# Patient Record
Sex: Female | Born: 1960 | Race: White | Hispanic: No | Marital: Single | Smoking: Never smoker
Health system: Southern US, Community
[De-identification: ages and names within clinical notes are randomized; demographics above are authoritative.]

## PROBLEM LIST (undated history)

## (undated) DIAGNOSIS — Z789 Other specified health status: Secondary | ICD-10-CM

## (undated) HISTORY — PX: TMJ ARTHROPLASTY: SHX1066

## (undated) HISTORY — PX: KNEE ARTHROSCOPY: SUR90

## (undated) HISTORY — DX: Other specified health status: Z78.9

---

## 2004-07-09 ENCOUNTER — Ambulatory Visit: Payer: Self-pay | Admitting: Family Medicine

## 2005-11-26 ENCOUNTER — Ambulatory Visit: Payer: Self-pay | Admitting: Family Medicine

## 2006-08-11 ENCOUNTER — Telehealth (INDEPENDENT_AMBULATORY_CARE_PROVIDER_SITE_OTHER): Payer: Self-pay | Admitting: *Deleted

## 2006-08-15 DIAGNOSIS — F329 Major depressive disorder, single episode, unspecified: Secondary | ICD-10-CM

## 2006-08-15 DIAGNOSIS — F3289 Other specified depressive episodes: Secondary | ICD-10-CM | POA: Insufficient documentation

## 2006-08-31 ENCOUNTER — Ambulatory Visit: Payer: Self-pay | Admitting: Family Medicine

## 2007-07-06 ENCOUNTER — Other Ambulatory Visit: Admission: RE | Admit: 2007-07-06 | Discharge: 2007-07-06 | Payer: Self-pay | Admitting: Family Medicine

## 2007-07-06 ENCOUNTER — Ambulatory Visit: Payer: Self-pay | Admitting: Family Medicine

## 2007-07-06 ENCOUNTER — Encounter (INDEPENDENT_AMBULATORY_CARE_PROVIDER_SITE_OTHER): Payer: Self-pay | Admitting: Internal Medicine

## 2007-07-07 ENCOUNTER — Encounter (INDEPENDENT_AMBULATORY_CARE_PROVIDER_SITE_OTHER): Payer: Self-pay | Admitting: Internal Medicine

## 2007-07-10 ENCOUNTER — Encounter: Payer: Self-pay | Admitting: Family Medicine

## 2007-07-17 ENCOUNTER — Encounter (INDEPENDENT_AMBULATORY_CARE_PROVIDER_SITE_OTHER): Payer: Self-pay | Admitting: Internal Medicine

## 2007-07-17 ENCOUNTER — Encounter (INDEPENDENT_AMBULATORY_CARE_PROVIDER_SITE_OTHER): Payer: Self-pay | Admitting: *Deleted

## 2007-11-30 ENCOUNTER — Encounter (INDEPENDENT_AMBULATORY_CARE_PROVIDER_SITE_OTHER): Payer: Self-pay | Admitting: *Deleted

## 2008-03-07 ENCOUNTER — Encounter (INDEPENDENT_AMBULATORY_CARE_PROVIDER_SITE_OTHER): Payer: Self-pay | Admitting: Internal Medicine

## 2008-03-14 ENCOUNTER — Telehealth (INDEPENDENT_AMBULATORY_CARE_PROVIDER_SITE_OTHER): Payer: Self-pay | Admitting: Internal Medicine

## 2008-05-29 ENCOUNTER — Ambulatory Visit: Payer: Self-pay | Admitting: Family Medicine

## 2008-05-29 DIAGNOSIS — M79609 Pain in unspecified limb: Secondary | ICD-10-CM

## 2008-09-30 ENCOUNTER — Ambulatory Visit: Payer: Self-pay | Admitting: Family Medicine

## 2008-10-08 ENCOUNTER — Ambulatory Visit: Payer: Self-pay | Admitting: Family Medicine

## 2008-10-11 ENCOUNTER — Telehealth (INDEPENDENT_AMBULATORY_CARE_PROVIDER_SITE_OTHER): Payer: Self-pay | Admitting: Internal Medicine

## 2009-09-16 ENCOUNTER — Ambulatory Visit: Payer: Self-pay

## 2012-02-09 ENCOUNTER — Ambulatory Visit: Payer: Self-pay

## 2012-03-19 ENCOUNTER — Ambulatory Visit: Payer: Self-pay | Admitting: Physician Assistant

## 2012-08-08 ENCOUNTER — Ambulatory Visit: Payer: Self-pay | Admitting: Specialist

## 2012-12-06 ENCOUNTER — Ambulatory Visit: Payer: Self-pay | Admitting: General Practice

## 2014-02-12 ENCOUNTER — Ambulatory Visit: Payer: Self-pay | Admitting: Family Medicine

## 2014-06-21 NOTE — Op Note (Signed)
PATIENT NAME:  [name], [name]  DATE OF PROCEDURE:  12/06/2012  PREOPERATIVE DIAGNOSIS:  Internal derangement of the left knee.   POSTOPERATIVE DIAGNOSES: 1.  Tear of the posterior horn of the medial meniscus, left knee.  2.  Tear of the anterior horn, lateral meniscus, left knee.  3.  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   PROCEDURE:  Left knee arthroscopy, partial medial and lateral meniscectomies and chondroplasty of the medial and patellofemoral compartments.   SURGEON:  Illene LabradorJames P. Hooten, M.D.   ANESTHESIA:  General.   ESTIMATED BLOOD LOSS:  Minimal.   TOURNIQUET TIME:  Not used.   DRAINS:  None.   INDICATIONS FOR SURGERY:  The patient is a 54 year old female who has been seen for complaints of persistent left knee pain.  MRI demonstrated findings consistent with meniscal pathology.  After discussion of the risks and benefits of surgical intervention, the patient expressed understanding of the risks and benefits and agreed with plans for surgical intervention.   PROCEDURE IN DETAIL:  The patient was brought into the Operating Room and, after adequate general anesthesia was achieved, a tourniquet was placed on the patient's left thigh and leg was placed in a leg holder.  All bony prominences were well-padded.  The left knee and leg were cleaned and prepped with alcohol and DuraPrep, draped in the usual sterile fashion.  A "timeout" was performed as per usual protocol.  The anticipated portal sites were injected with 0.25% Marcaine with epinephrine.  An anterolateral portal was created and a cannula was inserted.  A moderate effusion was evacuated.  The scope was inserted and the knee was inflated with using the Stryker pump.  The scope was advanced down to the medial gutter, into the medial compartment of the knee.  There was a complex degenerative tear involving the posterior horn of the medial meniscus.   The tear was debrided using meniscal punches and a 4.5 mm shaver.  Final contouring was performed using the ArthroCare wand.  The anterior horn of the medial meniscus was visualized and probed and felt to be stable.  Inspection of the articular cartilage demonstrated an area of grade IV chondromalacia involving the distal aspect of the medial femoral condyle.  The area was debrided and margins contoured.  The scope was advanced into the intracondylar region.  The anterior cruciate ligament was visualized and probed and felt to be stable.  The scope was removed from the anterolateral portal and reinserted via the anteromedial portal so as to better visualize the lateral compartment.  The posterior horn of the lateral meniscus was visualized and probed and felt to be stable.  There was a complex tear involving the anterior horn of the lateral meniscus.  This was debrided using meniscal punches and a 4.5 mm shaver with final contouring performed using the ArthroCare wand.  The remaining rim of meniscus was visualized and probed and felt to stable.  The articular surface was in reasonably good condition.  Finally, the scope was positioned so as to visualize the patellofemoral articulation.  The articular surface of the patella was in reasonably good condition.  However, there was an area of grade IV chondromalacia in the trochlear groove.  The area was debrided and contoured using the ArthroCare wand.  Mild synovectomy was performed along the site.  Hemostasis was achieved using the ArthroCare wand.  The knee was irrigated with copious amounts of fluid and then suctioned dry.  The anterolateral portal was reapproximated using #3-0 nylon.  A combination of 0.25% Marcaine with epinephrine and 4 mg of morphine was injected via the scope.  The scope was removed and the anteromedial portal was reapproximated using #3-0 nylon.  A sterile dressing was applied followed by application of ice wrap.    The patient tolerated the  procedure well.  She was transported to the recovery room in stable condition.     ____________________________ Illene Labrador. Angie Fava., MD jph:ea D: 12/07/2012 00:12:55 ET T: 12/07/2012 01:26:03 ET JOB#: [id number]  cc: Illene Labrador. Angie Fava., MD, <Dictator> JAMES P Angie Fava MD ELECTRONICALLY SIGNED 12/09/2012 15:11

## 2015-01-28 ENCOUNTER — Other Ambulatory Visit: Payer: Self-pay | Admitting: Internal Medicine

## 2015-01-28 DIAGNOSIS — Z1231 Encounter for screening mammogram for malignant neoplasm of breast: Secondary | ICD-10-CM

## 2015-02-18 ENCOUNTER — Ambulatory Visit
Admission: RE | Admit: 2015-02-18 | Discharge: 2015-02-18 | Disposition: A | Discharge status: Home / Self Care | Payer: No Typology Code available for payment source | Source: Ambulatory Visit | Attending: Internal Medicine | Admitting: Internal Medicine

## 2015-02-18 DIAGNOSIS — Z1231 Encounter for screening mammogram for malignant neoplasm of breast: Secondary | ICD-10-CM | POA: Insufficient documentation

## 2015-04-17 ENCOUNTER — Ambulatory Visit: Payer: Managed Care, Other (non HMO) | Attending: Internal Medicine

## 2015-04-17 DIAGNOSIS — G4733 Obstructive sleep apnea (adult) (pediatric): Secondary | ICD-10-CM | POA: Diagnosis not present

## 2016-01-16 ENCOUNTER — Other Ambulatory Visit: Payer: Self-pay | Admitting: Internal Medicine

## 2016-01-16 DIAGNOSIS — Z1231 Encounter for screening mammogram for malignant neoplasm of breast: Secondary | ICD-10-CM

## 2016-02-19 ENCOUNTER — Ambulatory Visit: Admission: RE | Admit: 2016-02-19 | Payer: No Typology Code available for payment source | Source: Ambulatory Visit

## 2016-03-10 ENCOUNTER — Ambulatory Visit: Admission: RE | Admit: 2016-03-10 | Payer: No Typology Code available for payment source | Source: Ambulatory Visit

## 2016-03-17 ENCOUNTER — Ambulatory Visit: Admission: RE | Admit: 2016-03-17 | Payer: No Typology Code available for payment source | Source: Ambulatory Visit

## 2016-03-23 ENCOUNTER — Ambulatory Visit
Admission: RE | Admit: 2016-03-23 | Discharge: 2016-03-23 | Disposition: A | Discharge status: Home / Self Care | Payer: BLUE CROSS/BLUE SHIELD | Source: Ambulatory Visit | Attending: Internal Medicine | Admitting: Internal Medicine

## 2016-03-23 ENCOUNTER — Encounter: Payer: Self-pay | Admitting: Radiology

## 2016-03-23 DIAGNOSIS — Z1231 Encounter for screening mammogram for malignant neoplasm of breast: Secondary | ICD-10-CM | POA: Diagnosis present

## 2017-01-11 ENCOUNTER — Ambulatory Visit (INDEPENDENT_AMBULATORY_CARE_PROVIDER_SITE_OTHER): Payer: BLUE CROSS/BLUE SHIELD

## 2017-01-11 ENCOUNTER — Ambulatory Visit (INDEPENDENT_AMBULATORY_CARE_PROVIDER_SITE_OTHER): Payer: BLUE CROSS/BLUE SHIELD | Admitting: Orthopaedic Surgery

## 2017-01-11 ENCOUNTER — Encounter: Payer: Self-pay | Admitting: Orthopaedic Surgery

## 2017-01-11 VITALS — BP 140/81 | HR 76 | Temp 97.1°F | Ht 66.5 in | Wt 275.0 lb

## 2017-01-11 DIAGNOSIS — G8929 Other chronic pain: Secondary | ICD-10-CM

## 2017-01-11 DIAGNOSIS — M5442 Lumbago with sciatica, left side: Secondary | ICD-10-CM

## 2017-01-11 MED ORDER — NAPROXEN 500 MG PO TABS: 500.0000 mg | ORAL_TABLET | Freq: Two times a day (BID) | ORAL | 5 refills | Status: AC

## 2017-01-11 NOTE — Progress Notes (Signed)
Subjective:    Patient ID: [name], female    DOB: 03/26/1960, 56 y.o.   MRN: [id number]  HPI She has had left hip pain and lower side of the lumbar spine pain for many months.  [place] was seen in [place] at the [facility] in January and February of this year and had x-rays which were read as negative of the hip on the left and the lumbar spine. I have reviewed the notes. She has taken Lodine with some help.  She has pain to the left hip and to the upper thigh area on the left.  She has no trauma.  She has lost weight since the first of the year and that has not helped that much.  She is being more active and sees little help with that.  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  She is concerned that the pain is still there.  She has pain sleeping. She sleeps on her stomach and is trying to lay on her side more.     Review of Systems  HENT: Negative for congestion.   Respiratory: Negative for cough and shortness of breath.   Cardiovascular: Negative for chest pain and leg swelling.  Endocrine: Negative for cold intolerance.  Musculoskeletal: Positive for arthralgias and back pain.  Allergic/Immunologic: Negative for environmental allergies.   Past Medical History:  Diagnosis Date  . Known health problems: none     Past Surgical History:  Procedure Laterality Date  . KNEE ARTHROSCOPY    . TMJ ARTHROPLASTY      Current Outpatient Medications on File Prior to Visit  Medication Sig Dispense Refill  . etodolac (LODINE) 500 MG tablet Take 500 mg by mouth.     No current facility-administered medications on file prior to visit.     Social History   Socioeconomic History  . Marital status: Single    Spouse name: Not on file  . Number of children: Not on file  . Years of education: Not on file  . Highest education level: Not on file  Social Needs  . Financial resource strain: Not on file  . Food insecurity - worry: Not on file  . Food insecurity - inability: Not on file    . Transportation needs - medical: Not on file  . Transportation needs - non-medical: Not on file  Occupational History  . Not on file  Tobacco Use  . Smoking status: Never Smoker  . Smokeless tobacco: Never Used  Substance and Sexual Activity  . Alcohol use: No    Frequency: Never  . Drug use: No  . Sexual activity: Not on file  Other Topics Concern  . Not on file  Social History Narrative  . Not on file    Family History  Problem Relation Age of Onset  . Hypertension Mother   . Clotting disorder Father   . Hypertension Father   . Breast cancer Neg Hx     BP 140/81   Pulse 76   Temp (!) 97.1 F (36.2 C)   Ht 5' 6.5" (1.689 m)   Wt 275 lb (124.7 kg)   LMP 12/31/2014 Comment: perimenopausal-irregular periods, not preg  BMI 43.72 kg/m      Objective:   Physical Exam  Constitutional: She is oriented to person, place, and time. She appears well-developed and well-nourished.  HENT:  Head: Normocephalic and atraumatic.  Eyes: Conjunctivae and EOM are normal. Pupils are equal, round, and reactive to light.  Neck: Normal range of  motion. Neck supple.  Cardiovascular: Normal rate, regular rhythm and intact distal pulses.  Pulmonary/Chest: Effort normal.  Abdominal: Soft.  Musculoskeletal: She exhibits tenderness (There is pain in the left lower back, no spasm.  ROM is full, she can touch her toes, NV intact, reflexes normal, SLR negative, Gait normal.).  Neurological: She is alert and oriented to person, place, and time. She displays normal reflexes. No cranial nerve deficit. She exhibits normal muscle tone. Coordination normal.  Skin: Skin is warm and dry.  Psychiatric: She has a normal mood and affect. Her behavior is normal. Judgment and thought content normal.  Vitals reviewed.  X-rays were done of the lumbar spine, reported separately.       Assessment & Plan:   Encounter Diagnosis  Name Primary?  . Chronic left-sided low back pain with left-sided sciatica  Yes   I will get a MRI of the lumbar spine as she has not improved since months of treatment.  I will call in Naprosyn to her drug store.  Stop the Lodine.  Return after the MRI.  Precautions on the Naprosyn given.  Take after eating.  Call if any problem.  Precautions discussed.   Electronically Signed Darreld McleanWayne Strother Everitt, MD 11/13/20183:01 PM

## 2017-01-31 ENCOUNTER — Ambulatory Visit
Admission: RE | Admit: 2017-01-31 | Discharge: 2017-01-31 | Disposition: A | Discharge status: Home / Self Care | Payer: BLUE CROSS/BLUE SHIELD | Source: Ambulatory Visit | Attending: Orthopaedic Surgery | Admitting: Orthopaedic Surgery

## 2017-01-31 DIAGNOSIS — G8929 Other chronic pain: Secondary | ICD-10-CM

## 2017-01-31 DIAGNOSIS — M5442 Lumbago with sciatica, left side: Principal | ICD-10-CM

## 2017-02-08 ENCOUNTER — Ambulatory Visit: Payer: BLUE CROSS/BLUE SHIELD | Admitting: Orthopaedic Surgery

## 2017-02-09 ENCOUNTER — Ambulatory Visit (INDEPENDENT_AMBULATORY_CARE_PROVIDER_SITE_OTHER): Payer: BLUE CROSS/BLUE SHIELD | Admitting: Orthopaedic Surgery

## 2017-02-09 ENCOUNTER — Encounter: Payer: Self-pay | Admitting: Orthopaedic Surgery

## 2017-02-09 VITALS — BP 153/96 | HR 75 | Temp 98.0°F | Ht 66.5 in | Wt 286.0 lb

## 2017-02-09 DIAGNOSIS — M48062 Spinal stenosis, lumbar region with neurogenic claudication: Secondary | ICD-10-CM | POA: Diagnosis not present

## 2017-02-09 MED ORDER — PREDNISONE 5 MG (21) PO TBPK: ORAL_TABLET | ORAL | 0 refills | Status: AC

## 2017-02-09 MED ORDER — HYDROCODONE-ACETAMINOPHEN 7.5-325 MG PO TABS: ORAL_TABLET | ORAL | 0 refills | Status: DC

## 2017-02-09 MED ORDER — TIZANIDINE HCL 4 MG PO TABS: ORAL_TABLET | ORAL | 3 refills | Status: AC

## 2017-02-09 MED ORDER — HYDROCODONE-ACETAMINOPHEN 7.5-325 MG PO TABS: 1.0000 | ORAL_TABLET | ORAL | 0 refills | Status: DC | PRN

## 2017-02-09 NOTE — Progress Notes (Signed)
Patient [name]:[name], female DOB:11/26/1960, 56 y.o. UJW:[id number]  Chief Complaint  Patient presents with  . Results    MRI Lumbar    HPI  [name] is a 56 y.o. female who has lower back pain with paresthesias to the left side.  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  The back pain is limiting her ability to practice.  She has more pain at night.  She had a MRI which showed:  IMPRESSION: 1. Transitional lumbosacral vertebra numbered S1 based on the lowest ribs by radiography 01/11/2017. 2. 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 3. L4-5 high-grade spinal stenosis. Asymmetric right subarticular recess impingement due to asymmetric disc bulging. 4. L5-S1 high-grade spinal stenosis. Left more than right subarticular recess impingement due to asymmetric disc bulge. 5. L2-3 small left paracentral disc protrusion with mild posterior displacement of the descending L3 nerve root. 6. No foraminal impingement.  I have explained the findings to her.  Her husband accompanied her.  I have recommended she see a neurosurgeon and consider epidural treatment.  I have explained the treatment possible options.  I spent 30 minutes with them going over this and things to do in her office to try to modify her pain such as raising the operating table, having a small lift for her foot to relieve back pain, etc.  I will begin Zanaflex at night, I have given pain medicine for her use at night when her pain is so bad and I have given a prednisone dose pack to use if pain get worse and the Naprosyn has not helped that much.  She is to stop the Naprosyn when on the dose pack. HPI  Body mass index is 45.47 kg/m.  ROS  Review of Systems  HENT: Negative for congestion.   Respiratory: Negative for cough and shortness of breath.   Cardiovascular: Negative for chest pain and leg swelling.  Endocrine:  Negative for cold intolerance.  Musculoskeletal: Positive for arthralgias and back pain.  Allergic/Immunologic: Negative for environmental allergies.  All other systems reviewed and are negative.   Past Medical History:  Diagnosis Date  . Known health problems: none     Past Surgical History:  Procedure Laterality Date  . KNEE ARTHROSCOPY    . TMJ ARTHROPLASTY      Family History  Problem Relation Age of Onset  . Hypertension Mother   . Clotting disorder Father   . Hypertension Father   . Breast cancer Neg Hx     Social History Social History   Tobacco Use  . Smoking status: Never Smoker  . Smokeless tobacco: Never Used  Substance Use Topics  . Alcohol use: No    Frequency: Never  . Drug use: No    Allergies  Allergen Reactions  . Penicillins     REACTION: Hives  . Sulfadiazine     REACTION: Hives    Current Outpatient Medications  Medication Sig Dispense Refill  . etodolac (LODINE) 500 MG tablet Take 500 mg by mouth.    . naproxen (NAPROSYN) 500 MG tablet Take 1 tablet (500 mg total) 2 (two) times daily with a meal by mouth. 60 tablet 5  . HYDROcodone-acetaminophen (NORCO) 7.5-325 MG tablet Take 1 tablet by mouth every 4 (four) hours as needed for moderate pain (Must last 30 days.  Do not drive a call or operate machinery while on this medicine.). 120 tablet 0  . predniSONE (STERAPRED UNI-PAK 21 TAB)  5 MG (21) TBPK tablet Take 6 pills first day; 5 pills second day; 4 pills third day; 3 pills fourth day; 2 pills next day and 1 pill last day. 21 tablet 0  . tiZANidine (ZANAFLEX) 4 MG tablet One by mouth every night before bed as needed for spasm 30 tablet 3   No current facility-administered medications for this visit.      Physical Exam  Blood pressure (!) 153/96, pulse 75, temperature 98 F (36.7 C), height 5' 6.5" (1.689 m), weight 286 lb (129.7 kg), last menstrual period 12/31/2014.  Constitutional: overall normal hygiene, normal nutrition, well  developed, normal grooming, normal body habitus. Assistive device:none  Musculoskeletal: gait and station Limp none, muscle tone and strength are normal, no tremors or atrophy is present.  .  Neurological: coordination overall normal.  Deep tendon reflex/nerve stretch intact.  Sensation normal.  Cranial nerves II-XII intact.   Skin:   Normal overall no scars, lesions, ulcers or rashes. No psoriasis.  Psychiatric: Alert and oriented x 3.  Recent memory intact, remote memory unclear.  Normal mood and affect. Well groomed.  Good eye contact.  Cardiovascular: overall no swelling, no varicosities, no edema bilaterally, normal temperatures of the legs and arms, no clubbing, cyanosis and good capillary refill.  Lymphatic: palpation is normal.  All other systems reviewed and are negative   Spine/Pelvis examination:  Inspection:  Overall, sacoiliac joint benign and hips nontender; without crepitus or defects.   Thoracic spine inspection: Alignment normal without kyphosis present   Lumbar spine inspection:  Alignment  with normal lumbar lordosis, without scoliosis apparent.   Thoracic spine palpation:  without tenderness of spinal processes   Lumbar spine palpation: with tenderness of lumbar area; without tightness of lumbar muscles    Range of Motion:   Lumbar flexion, forward flexion is 35 without pain or tenderness    Lumbar extension is 5 without pain or tenderness   Left lateral bend is Normal  without pain or tenderness   Right lateral bend is Normal without pain or tenderness   Straight leg raising is Normal   Strength & tone: Normal   Stability overall normal stability    The patient has been educated about the nature of the problem(s) and counseled on treatment options.  The patient appeared to understand what I have discussed and is in agreement with it.  Encounter Diagnosis  Name Primary?  . Spinal stenosis, lumbar region, with neurogenic claudication Yes    PLAN Call  if any problems.  Precautions discussed. Change medications as mentioned above. Take the Zanaflex when she does not have to go to work the next day to make sure she has no drowsiness.  Return to clinic PRN, to see the neurosurgeon   I have reviewed the [facility] Controlled Substance Reporting System web site prior to prescribing narcotic medicine for this patient.  Electronically Signed Darreld McleanWayne Bannie Lobban, MD 12/12/20189:20 AM

## 2017-02-09 NOTE — Addendum Note (Signed)
Addended by: Earnstine RegalKEELING, JOHN W on: 02/09/2017 11:43 AM   Modules accepted: Orders

## 2017-02-15 ENCOUNTER — Telehealth: Payer: Self-pay | Admitting: Orthopaedic Surgery

## 2017-02-15 MED ORDER — HYDROCODONE-ACETAMINOPHEN 7.5-325 MG PO TABS: ORAL_TABLET | ORAL | 0 refills | Status: DC

## 2017-02-15 NOTE — Telephone Encounter (Signed)
Patient requests refill on Hydrocodone/Acetaminophen 7.5-325  Mgs.   Qty  30  Sig: One tablet every four hours as needed for pain.  [facility] [place], [place]

## 2017-03-14 ENCOUNTER — Telehealth: Payer: Self-pay | Admitting: Radiology

## 2017-03-14 NOTE — Telephone Encounter (Signed)
 neurosurgery called back, they do not take patients insurance coverage.

## 2017-03-28 ENCOUNTER — Telehealth: Payer: Self-pay | Admitting: Orthopaedic Surgery

## 2017-03-28 MED ORDER — HYDROCODONE-ACETAMINOPHEN 7.5-325 MG PO TABS: ORAL_TABLET | ORAL | 0 refills | Status: AC

## 2017-03-28 NOTE — Telephone Encounter (Signed)
Patient requests refill on Hydrocodone/Acetaminophen 7.5-325 mgs.   Qty  30       Sig: One tablet every four hours as needed for pain.     She was unable to get into [facility] because they dont take her insurance.  She is attempting to get an appointment either at [facility] or [place] but has not been given an appointment yet.  She states hopefully she will get one next month.  She [facility] In [place], [place]

## 2017-06-14 ENCOUNTER — Other Ambulatory Visit: Payer: Self-pay | Admitting: Internal Medicine

## 2017-06-14 DIAGNOSIS — Z1231 Encounter for screening mammogram for malignant neoplasm of breast: Secondary | ICD-10-CM

## 2017-06-16 ENCOUNTER — Encounter (INDEPENDENT_AMBULATORY_CARE_PROVIDER_SITE_OTHER): Payer: Self-pay

## 2017-06-16 ENCOUNTER — Ambulatory Visit
Admission: RE | Admit: 2017-06-16 | Discharge: 2017-06-16 | Disposition: A | Discharge status: Home / Self Care | Payer: BLUE CROSS/BLUE SHIELD | Source: Ambulatory Visit | Attending: Internal Medicine | Admitting: Internal Medicine

## 2017-06-16 DIAGNOSIS — Z1231 Encounter for screening mammogram for malignant neoplasm of breast: Secondary | ICD-10-CM

## 2018-11-03 IMAGING — MG MM DIGITAL SCREENING BILAT W/ TOMO W/ CAD
8 of 13 series · 8 of 29 positions shown · non-contrast
Comparison: Previous exam(s).

CLINICAL DATA: Screening.

EXAM:
DIGITAL SCREENING BILATERAL MAMMOGRAM WITH TOMO AND CAD

[L CC (1 of 2)]
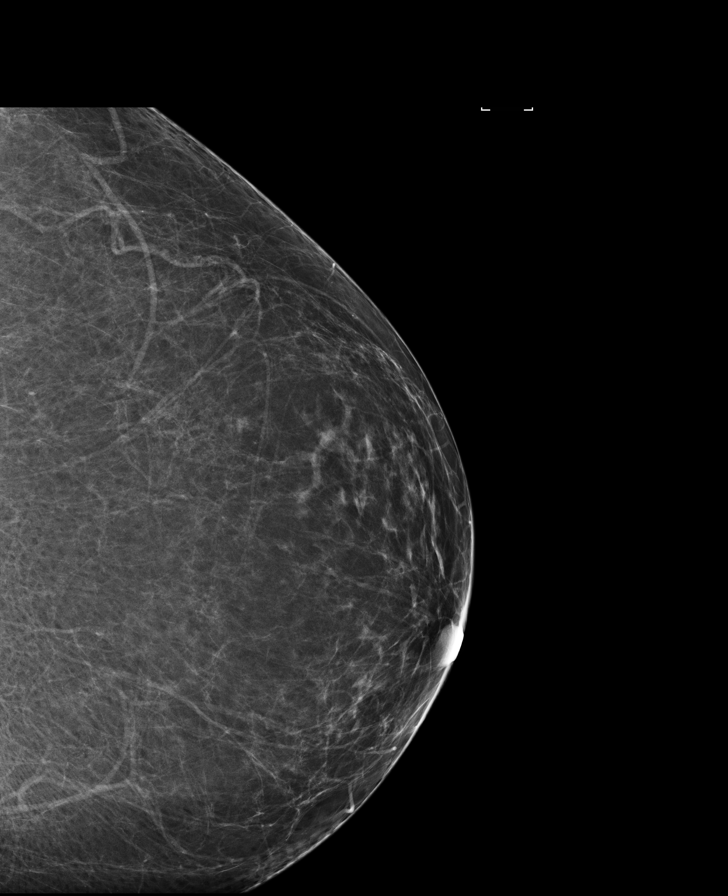

[R CC]
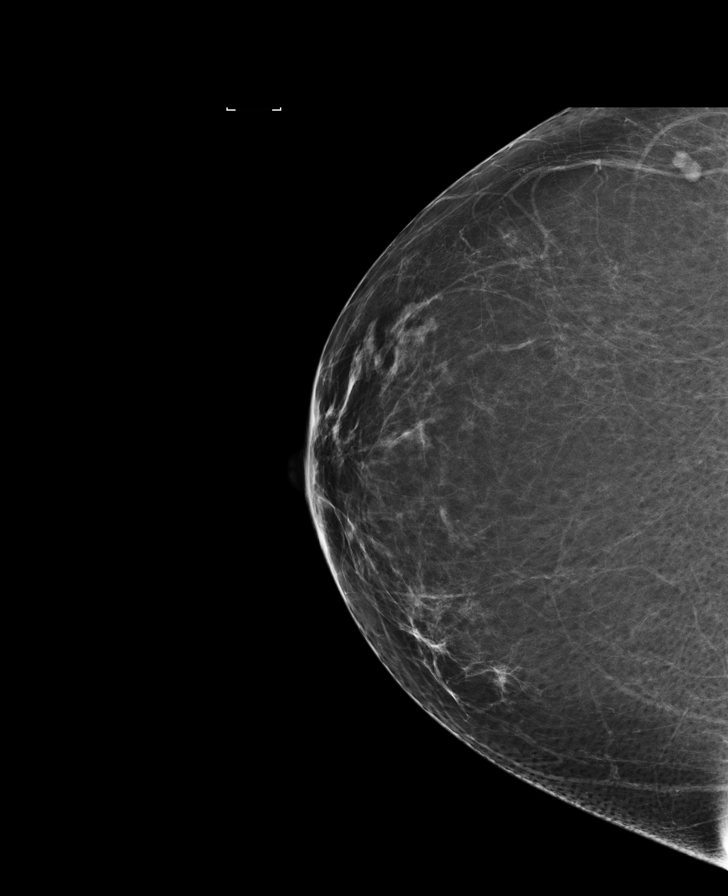

[L MLO]
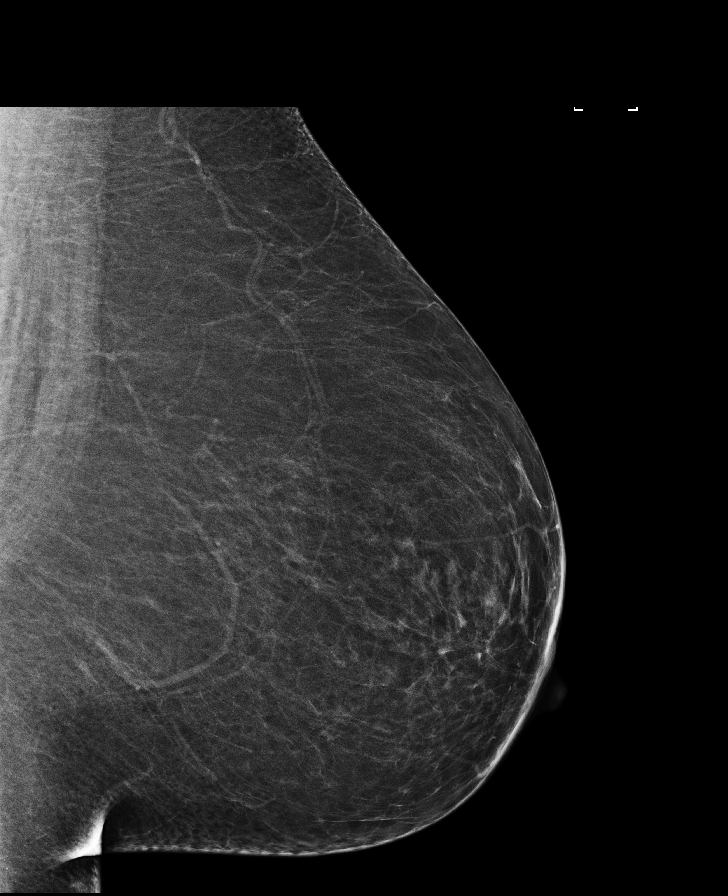

[R MLO]
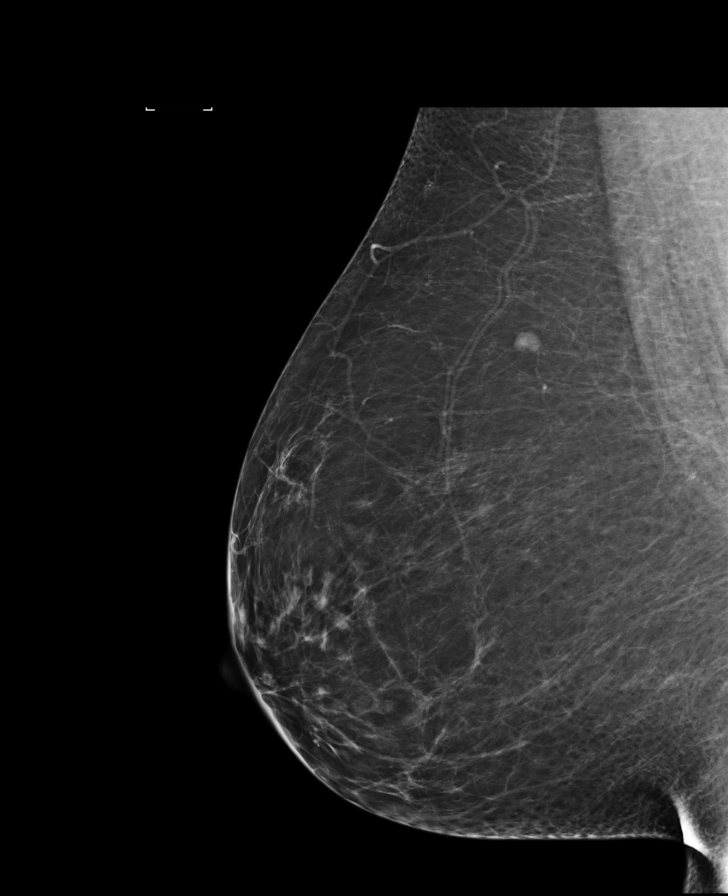

[L MLO synth-2D]
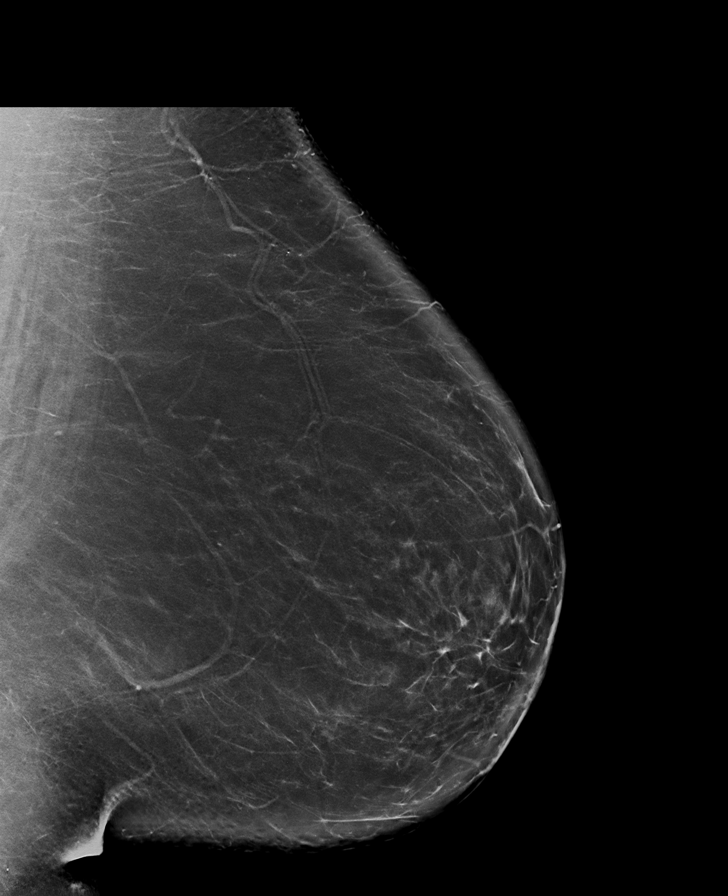

[R CC synth-2D]
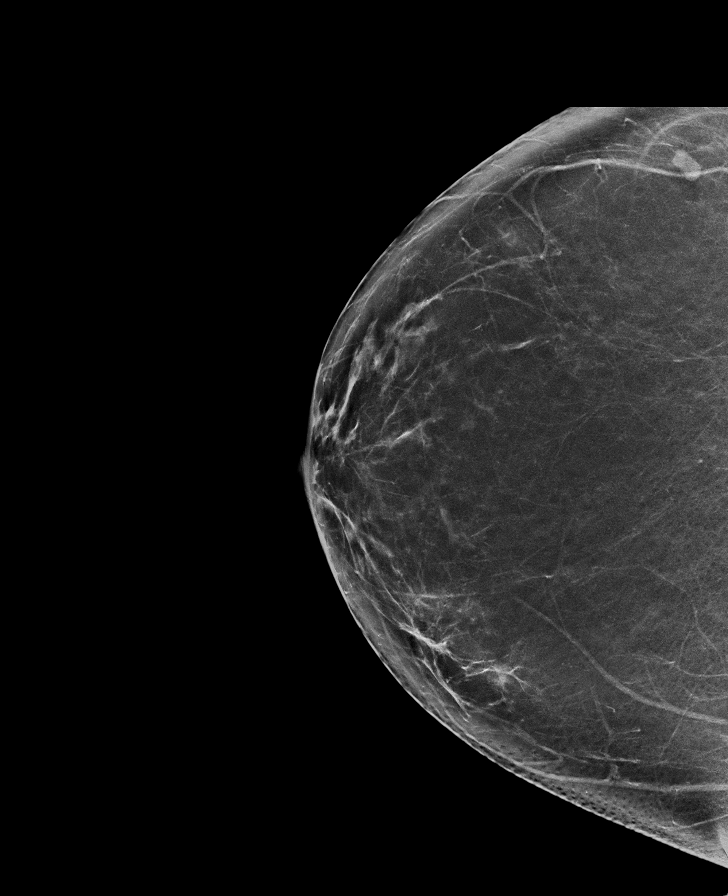

[L CC synth-2D]
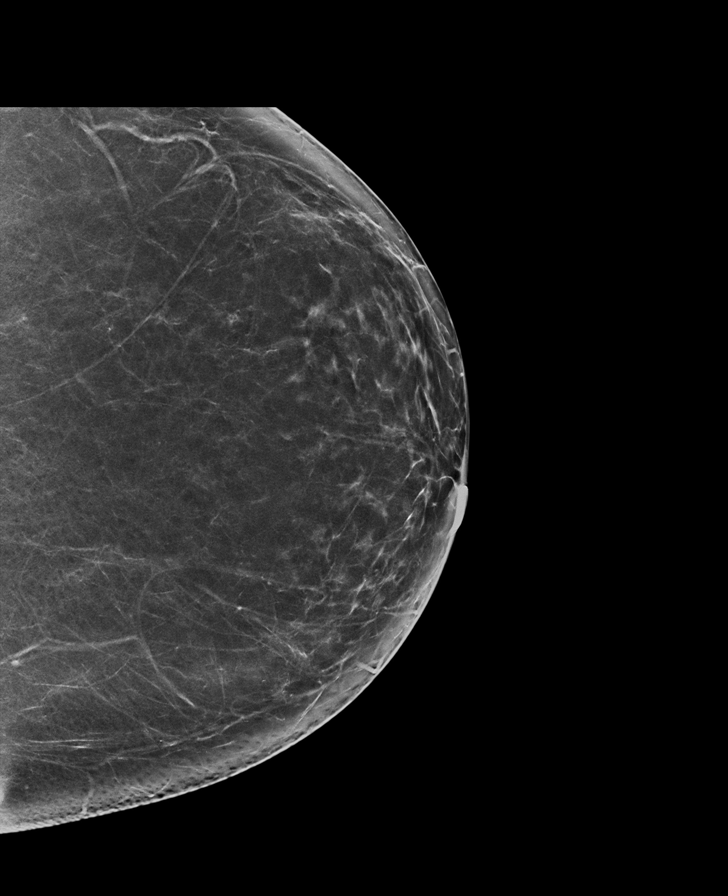

[L CC (2 of 2)]
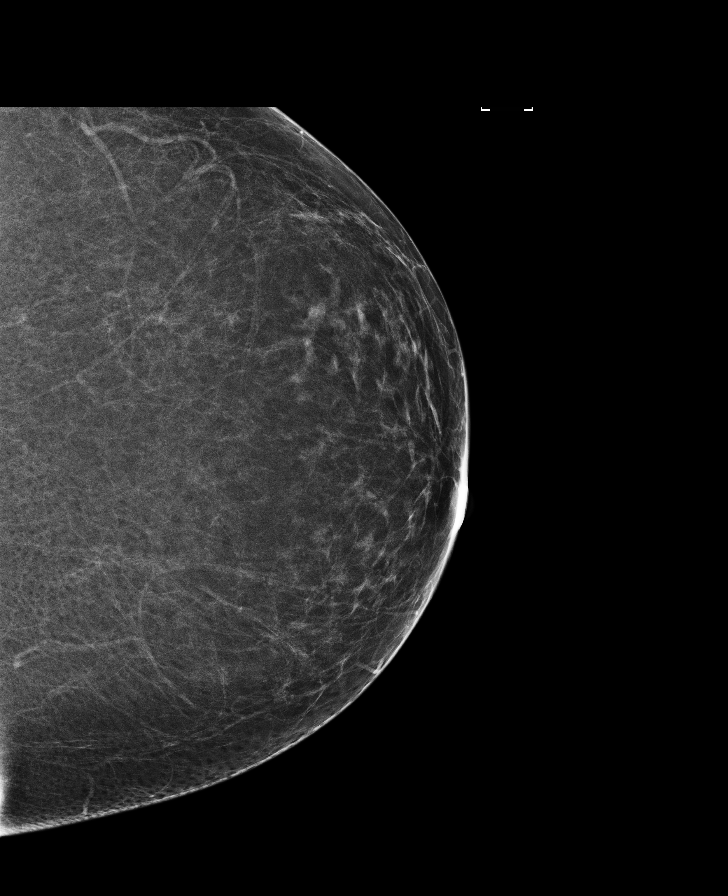

[8 of 29 positions shown; findings below may reference images not displayed]

ACR Breast Density Category b: There are scattered areas of
fibroglandular density.
FINDINGS: There are no findings suspicious for malignancy. Images were
processed with CAD.
IMPRESSION: No mammographic evidence of malignancy. A result letter of this
screening mammogram will be mailed directly to the patient.

RECOMMENDATION:
Screening mammogram in one year. (Code:[id number])

BI-RADS CATEGORY  1: Negative.

## 2019-02-22 ENCOUNTER — Ambulatory Visit: Payer: BLUE CROSS/BLUE SHIELD | Attending: Internal Medicine

## 2019-02-22 DIAGNOSIS — Z20822 Contact with and (suspected) exposure to covid-19: Secondary | ICD-10-CM

## 2019-02-23 LAB — NOVEL CORONAVIRUS, NAA: SARS-CoV-2, NAA: NOT DETECTED

## 2019-03-01 ENCOUNTER — Ambulatory Visit: Payer: BLUE CROSS/BLUE SHIELD | Attending: Internal Medicine

## 2019-03-01 DIAGNOSIS — Z20822 Contact with and (suspected) exposure to covid-19: Secondary | ICD-10-CM

## 2019-03-04 ENCOUNTER — Telehealth: Payer: Self-pay

## 2019-03-04 NOTE — Telephone Encounter (Signed)
Pt called for covid results advised that results are not back 

## 2019-03-06 ENCOUNTER — Ambulatory Visit: Payer: BLUE CROSS/BLUE SHIELD | Attending: Internal Medicine

## 2019-03-06 DIAGNOSIS — Z20822 Contact with and (suspected) exposure to covid-19: Secondary | ICD-10-CM

## 2019-03-07 LAB — NOVEL CORONAVIRUS, NAA

## 2019-03-08 LAB — NOVEL CORONAVIRUS, NAA: SARS-CoV-2, NAA: DETECTED — AB

## 2019-10-24 ENCOUNTER — Other Ambulatory Visit: Payer: Self-pay | Admitting: Internal Medicine

## 2019-10-24 DIAGNOSIS — Z1231 Encounter for screening mammogram for malignant neoplasm of breast: Secondary | ICD-10-CM

## 2020-01-25 ENCOUNTER — Ambulatory Visit: Admit: 2020-01-25 | Discharge status: Against Medical Advice | Payer: BLUE CROSS/BLUE SHIELD

## 2020-01-25 ENCOUNTER — Ambulatory Visit
Admission: EM | Admit: 2020-01-25 | Discharge: 2020-01-25 | Discharge status: Against Medical Advice | Payer: BLUE CROSS/BLUE SHIELD

## 2020-01-25 ENCOUNTER — Emergency Department
Admission: EM | Admit: 2020-01-25 | Discharge: 2020-01-25 | Disposition: A | Discharge status: Home / Self Care | Payer: BC Managed Care – PPO | Attending: Emergency Medicine | Admitting: Emergency Medicine

## 2020-01-25 ENCOUNTER — Other Ambulatory Visit: Payer: Self-pay

## 2020-01-25 DIAGNOSIS — R109 Unspecified abdominal pain: Secondary | ICD-10-CM | POA: Diagnosis present

## 2020-01-25 DIAGNOSIS — Z5321 Procedure and treatment not carried out due to patient leaving prior to being seen by health care provider: Secondary | ICD-10-CM | POA: Insufficient documentation

## 2020-01-25 DIAGNOSIS — R82998 Other abnormal findings in urine: Secondary | ICD-10-CM | POA: Insufficient documentation

## 2020-01-25 DIAGNOSIS — R1110 Vomiting, unspecified: Secondary | ICD-10-CM | POA: Diagnosis not present

## 2020-01-25 LAB — COMPREHENSIVE METABOLIC PANEL
ALT: 17 U/L (ref 0–44)
AST: 15 U/L (ref 15–41)
Albumin: 4.1 g/dL (ref 3.5–5.0)
Alkaline Phosphatase: 84 U/L (ref 38–126)
Anion gap: 11 (ref 5–15)
BUN: 13 mg/dL (ref 6–20)
CO2: 25 mmol/L (ref 22–32)
Calcium: 9 mg/dL (ref 8.9–10.3)
Chloride: 104 mmol/L (ref 98–111)
Creatinine, Ser: 0.82 mg/dL (ref 0.44–1.00)
GFR, Estimated: >60 mL/min (ref >60)
Glucose, Bld: 110 mg/dL — ABNORMAL HIGH (ref 70–99)
Potassium: 4.2 mmol/L (ref 3.5–5.1)
Sodium: 140 mmol/L (ref 135–145)
Total Bilirubin: 0.7 mg/dL (ref 0.3–1.2)
Total Protein: 7.7 g/dL (ref 6.5–8.1)

## 2020-01-25 LAB — CBC
HCT: 46.9 % — ABNORMAL HIGH (ref 36.0–46.0)
Hemoglobin: 15.2 g/dL — ABNORMAL HIGH (ref 12.0–15.0)
MCH: 29.5 pg (ref 26.0–34.0)
MCHC: 32.4 g/dL (ref 30.0–36.0)
MCV: 91.1 fL (ref 80.0–100.0)
Platelets: 326 10*3/uL (ref 150–400)
RBC: 5.15 MIL/uL — ABNORMAL HIGH (ref 3.87–5.11)
RDW: 12.6 % (ref 11.5–15.5)
WBC: 10.9 10*3/uL — ABNORMAL HIGH (ref 4.0–10.5)
nRBC: 0 % (ref 0.0–0.2)

## 2020-01-25 LAB — LIPASE, BLOOD: Lipase: 36 U/L (ref 11–51)

## 2020-01-25 NOTE — ED Triage Notes (Addendum)
Pt comes via POV from home with abdominal pain and vomiting the patient states this started two days ago. Pt states she thinks it is from her new medication.  Pt also states dark colored urine.

## 2020-01-26 ENCOUNTER — Ambulatory Visit: Payer: Self-pay

## 2020-01-28 ENCOUNTER — Ambulatory Visit
Admission: EM | Admit: 2020-01-28 | Discharge: 2020-01-28 | Disposition: A | Discharge status: Home / Self Care | Payer: BC Managed Care – PPO | Attending: Emergency Medicine | Admitting: Emergency Medicine

## 2020-01-28 ENCOUNTER — Other Ambulatory Visit: Payer: Self-pay

## 2020-01-28 DIAGNOSIS — R112 Nausea with vomiting, unspecified: Secondary | ICD-10-CM | POA: Diagnosis present

## 2020-01-28 DIAGNOSIS — R197 Diarrhea, unspecified: Secondary | ICD-10-CM | POA: Insufficient documentation

## 2020-01-28 LAB — CBC WITH DIFFERENTIAL/PLATELET
Abs Immature Granulocytes: 0.05 10*3/uL (ref 0.00–0.07)
Basophils Absolute: 0 10*3/uL (ref 0.0–0.1)
Basophils Relative: 0 %
Eosinophils Absolute: 0.5 10*3/uL (ref 0.0–0.5)
Eosinophils Relative: 5 %
HCT: 48 % — ABNORMAL HIGH (ref 36.0–46.0)
Hemoglobin: 16 g/dL — ABNORMAL HIGH (ref 12.0–15.0)
Immature Granulocytes: 1 %
Lymphocytes Relative: 14 %
Lymphs Abs: 1.4 10*3/uL (ref 0.7–4.0)
MCH: 29.5 pg (ref 26.0–34.0)
MCHC: 33.3 g/dL (ref 30.0–36.0)
MCV: 88.6 fL (ref 80.0–100.0)
Monocytes Absolute: 0.8 10*3/uL (ref 0.1–1.0)
Monocytes Relative: 8 %
Neutro Abs: 7.1 10*3/uL (ref 1.7–7.7)
Neutrophils Relative %: 72 %
Platelets: 317 10*3/uL (ref 150–400)
RBC: 5.42 MIL/uL — ABNORMAL HIGH (ref 3.87–5.11)
RDW: 12.8 % (ref 11.5–15.5)
WBC: 9.8 10*3/uL (ref 4.0–10.5)
nRBC: 0 % (ref 0.0–0.2)

## 2020-01-28 LAB — URINALYSIS, COMPLETE (UACMP) WITH MICROSCOPIC
Glucose, UA: NEGATIVE mg/dL
Ketones, ur: 15 mg/dL — AB
Nitrite: NEGATIVE
Protein, ur: 30 mg/dL — AB
Specific Gravity, Urine: >1.03 — ABNORMAL HIGH (ref 1.005–1.030)
pH: 5 (ref 5.0–8.0)

## 2020-01-28 LAB — COMPREHENSIVE METABOLIC PANEL
ALT: 15 U/L (ref 0–44)
AST: 14 U/L — ABNORMAL LOW (ref 15–41)
Albumin: 3.4 g/dL — ABNORMAL LOW (ref 3.5–5.0)
Alkaline Phosphatase: 86 U/L (ref 38–126)
Anion gap: 9 (ref 5–15)
BUN: 17 mg/dL (ref 6–20)
CO2: 23 mmol/L (ref 22–32)
Calcium: 8.5 mg/dL — ABNORMAL LOW (ref 8.9–10.3)
Chloride: 105 mmol/L (ref 98–111)
Creatinine, Ser: 0.8 mg/dL (ref 0.44–1.00)
GFR, Estimated: >60 mL/min (ref >60)
Glucose, Bld: 111 mg/dL — ABNORMAL HIGH (ref 70–99)
Potassium: 3.6 mmol/L (ref 3.5–5.1)
Sodium: 137 mmol/L (ref 135–145)
Total Bilirubin: 0.8 mg/dL (ref 0.3–1.2)
Total Protein: 6.7 g/dL (ref 6.5–8.1)

## 2020-01-28 MED ORDER — ONDANSETRON 8 MG PO TBDP: 8.0000 mg | ORAL_TABLET | Freq: Three times a day (TID) | ORAL | 0 refills | Status: AC | PRN

## 2020-01-28 MED ORDER — DICYCLOMINE HCL 20 MG PO TABS: 20.0000 mg | ORAL_TABLET | Freq: Two times a day (BID) | ORAL | 0 refills | Status: AC

## 2020-01-28 NOTE — ED Triage Notes (Addendum)
Patient states that she has been having abdominal pain since Wednesday. States that she went to [facility] ED on Friday but wasn't seen but did have lab work done. States that she has been having abdominal pain throughout the entire time. States that she she has extreme belching and flatulence. States that around 7 weeks ago she started a new injectable medication called Reginal Lutes that is for weightloss. States that she has noticed that she may be having side effects from this medication. States that she has been having diarrhea as well.

## 2020-01-28 NOTE — ED Provider Notes (Addendum)
[facility]    CSN: [id number] Arrival date & time: 01/28/20  0801      History   Chief Complaint Chief Complaint  Patient presents with  . Abdominal Pain  . Diarrhea    HPI [name] is a 59 y.o. female.   HPI   59 year old female here for evaluation of abdominal pain, diarrhea, flatulence, belching, and vomiting x5 days.  Patient states that whenever she eats or drinks anything that it will stay down a couple of hours then she will develop intense cramping and either have emesis, diarrhea, or both.  She also reports that her urine has been darker yellow than normal but denies any urinary complaints.  Her abdominal pain is generalized and comes and goes.  Patient denies fever, syncope, blood in her vomit or stool, or sick contacts.  Patient states that she has been eating the same thing that her husband and her sister have been eating and they are not sick.  Patient denies any recent antibiotic use.  Patient has recently started Ozempic subQ for weight loss.  She is on week 7 and her dose of 0.5 mg once weekly.  She has been at the 0.5 mg level for the past 3 weeks.  She was started on this by her PCP but has not spoken to him about whether or not he thinks her symptoms are side effects of the medication.  Patient has had her Covid vaccine, as well as having Covid, but has not had her flu vaccine.  Past Medical History:  Diagnosis Date  . Known health problems: none     Patient Active Problem List   Diagnosis Date Noted  . LEG PAIN, RIGHT 05/29/2008  . MORBID OBESITY 07/06/2007  . DEPRESSION 08/15/2006    Past Surgical History:  Procedure Laterality Date  . KNEE ARTHROSCOPY    . TMJ ARTHROPLASTY      OB History   No obstetric history on file.      Home Medications    Prior to Admission medications   Medication Sig Start Date End Date Taking? Authorizing Provider  buPROPion (WELLBUTRIN XL) 150 MG 24 hr tablet Take 150 mg by mouth every  morning. 12/17/19  Yes [provider]  gabapentin (NEURONTIN) 300 MG capsule Take by mouth. 12/15/19  Yes [provider]  meloxicam (MOBIC) 15 MG tablet Take 15 mg by mouth daily. 12/08/19  Yes [provider]  WEGOVY 0.5 MG/0.5ML SOAJ  12/31/19  Yes [provider]  dicyclomine (BENTYL) 20 MG tablet Take 1 tablet (20 mg total) by mouth 2 (two) times daily. 01/28/20   Margarette Canada, NP  etodolac (LODINE) 500 MG tablet Take 500 mg by mouth. 09/10/16   [provider]  HYDROcodone-acetaminophen (NORCO) 7.5-325 MG tablet One tablet every four hours as needed for pain. 03/28/17   Sanjuana Kava, MD  naproxen (NAPROSYN) 500 MG tablet Take 1 tablet (500 mg total) 2 (two) times daily with a meal by mouth. 01/11/17   Sanjuana Kava, MD  ondansetron (ZOFRAN ODT) 8 MG disintegrating tablet Take 1 tablet (8 mg total) by mouth every 8 (eight) hours as needed for nausea or vomiting. 01/28/20   Margarette Canada, NP  predniSONE (STERAPRED UNI-PAK 21 TAB) 5 MG (21) TBPK tablet Take 6 pills first day; 5 pills second day; 4 pills third day; 3 pills fourth day; 2 pills next day and 1 pill last day. 02/09/17   Sanjuana Kava, MD  tiZANidine (ZANAFLEX) 4 MG tablet  One by mouth every night before bed as needed for spasm 02/09/17   Sanjuana Kava, MD    Family History Family History  Problem Relation Age of Onset  . Hypertension Mother   . Clotting disorder Father   . Hypertension Father   . Breast cancer Neg Hx     Social History Social History   Tobacco Use  . Smoking status: Never Smoker  . Smokeless tobacco: Never Used  Substance Use Topics  . Alcohol use: No  . Drug use: No     Allergies   Penicillins and Sulfadiazine   Review of Systems Review of Systems  Constitutional: Negative for activity change, appetite change and fever.  HENT: Negative for congestion, rhinorrhea and sore throat.   Respiratory: Negative for cough, shortness of breath and wheezing.    Cardiovascular: Negative for chest pain.  Gastrointestinal: Positive for abdominal pain, diarrhea, nausea and vomiting. Negative for blood in stool and constipation.  Genitourinary: Negative for dysuria, frequency and urgency.  Musculoskeletal: Negative for arthralgias and myalgias.  Skin: Negative for rash.  Neurological: Negative for syncope.  Hematological: Negative.   Psychiatric/Behavioral: Negative.      Physical Exam Triage Vital Signs ED Triage Vitals  Enc Vitals Group     BP      Pulse      Resp      Temp      Temp src      SpO2      Weight      Height      Head Circumference      Peak Flow      Pain Score      Pain Loc      Pain Edu?      Excl. in [place]?    No data found.  Updated Vital Signs BP (!) 138/98 (BP Location: Right Arm)   Pulse 97   Temp 98.1 F (36.7 C) (Oral)   Resp 18   Ht $R'5\' 6"'yN$  (1.676 m)   Wt 265 lb (120.2 kg)   SpO2 100%   BMI 42.77 kg/m   Visual Acuity Right Eye Distance:   Left Eye Distance:   Bilateral Distance:    Right Eye Near:   Left Eye Near:    Bilateral Near:     Physical Exam Vitals and nursing note reviewed.  Constitutional:      General: She is not in acute distress.    Appearance: She is well-developed. She is obese. She is not toxic-appearing.  HENT:     Head: Normocephalic and atraumatic.  Eyes:     General: No scleral icterus.    Extraocular Movements: Extraocular movements intact.     Pupils: Pupils are equal, round, and reactive to light.  Cardiovascular:     Rate and Rhythm: Normal rate and regular rhythm.     Heart sounds: Normal heart sounds. No murmur heard.  No gallop.   Pulmonary:     Effort: Pulmonary effort is normal.     Breath sounds: Normal breath sounds. No wheezing, rhonchi or rales.  Abdominal:     General: Abdomen is protuberant. Bowel sounds are normal. There is no distension.     Palpations: Abdomen is soft. There is no hepatomegaly or splenomegaly.     Tenderness: There is no  abdominal tenderness.  Skin:    General: Skin is warm and dry.     Capillary Refill: Capillary refill takes less than 2 seconds.     Findings: No erythema  or rash.  Neurological:     General: No focal deficit present.     Mental Status: She is alert and oriented to person, place, and time.  Psychiatric:        Mood and Affect: Mood normal.        Behavior: Behavior normal.      UC Treatments / Results  Labs (all labs ordered are listed, but only abnormal results are displayed) Labs Reviewed  CBC WITH DIFFERENTIAL/PLATELET - Abnormal; Notable for the following components:      Result Value   RBC 5.42 (*)    Hemoglobin 16.0 (*)    HCT 48.0 (*)    All other components within normal limits  COMPREHENSIVE METABOLIC PANEL - Abnormal; Notable for the following components:   Glucose, Bld 111 (*)    Calcium 8.5 (*)    Albumin 3.4 (*)    AST 14 (*)    All other components within normal limits  URINALYSIS, COMPLETE (UACMP) WITH MICROSCOPIC - Abnormal; Notable for the following components:   APPearance CLOUDY (*)    Specific Gravity, Urine >1.030 (*)    Hgb urine dipstick SMALL (*)    Bilirubin Urine SMALL (*)    Ketones, ur 15 (*)    Protein, ur 30 (*)    Leukocytes,Ua SMALL (*)    Bacteria, UA MANY (*)    All other components within normal limits  GASTROINTESTINAL PANEL BY PCR, STOOL (REPLACES STOOL CULTURE)  C DIFFICILE (CDIFF) QUICK SCRN (NO PCR REFLEX)  URINE CULTURE    EKG   Radiology No results found.  Procedures Procedures (including critical care time)  Medications Ordered in UC Medications - No data to display  Initial Impression / Assessment and Plan / UC Course  I have reviewed the triage vital signs and the nursing notes.  Pertinent labs & imaging results that were available during my care of the patient were reviewed by me and considered in my medical decision making (see chart for details).   Patient is here for evaluation of abdominal pain, diarrhea,  and vomiting that been going on for the past 5 days.  Patient has had no fever or illness.  She and her family are eating the same things and no one else is sick.  She does not report any recent antibiotic use and she does not report eating anything that was suspect.  Patient has had no fever.  Patient was evaluated in the emergency department 3 days ago where she had lab work but she left before being seen.  At that time her LFTs were normal, renal function was normal CBC was normal as well.  No UA was collected.  Suspect her symptoms may be coming from her Ozempic.  Will repeat CBC, CMP, obtain UA, and send stool for PCR analysis to rule out infectious process.  Advised patient to hold her Ozempic for the time being until she can discuss it with her prescribing physician.  CBC shows mildly elevated RBC count of 5.42 and an H&H of 16 and 48.  No evidence of leukocytosis.  CMP shows normal sodium, potassium, and chloride.  BUN 17, creatinine 0.8.  Albumin and AST are mildly decreased but transaminases are largely unremarkable.  UA shows cloudy appearance, specific gravity greater than 1.030, small hemoglobin and bilirubin, 15 ketones, 30 protein, nitrite negative, small leukocytes with 21-50 squamous and 11-20 WBCs.  Many bacteria.  Will send urine for culture.  Will discharge patient home with the stool collection kit  for her to return.  My suspicion is that her symptoms are being caused by the [place].  I am going to advise patient to hold her Ozempic, will give her Zofran to help with nausea, and have her follow-up with her PCP.  Her urine was contaminated so I am sending it for culture.  If the culture is positive then I will treat her for UTI.  We will also give the patient Bentyl for her abdominal cramping.  Final Clinical Impressions(s) / UC Diagnoses   Final diagnoses:  Nausea vomiting and diarrhea     Discharge Instructions     Hold your Ozempic as I believe this is what is the cause of  your symptoms.  Use the Zofran every 8 hours as needed for nausea.  Take the Bentyl every 6 hours as needed for abdominal cramping.  Follow a clear liquid diet for the next 12 hours and then slowly increase as tolerated.  You need to try and increase your oral fluid intake to help increase your urine production and flush your system.  If you are unable to keep down fluids despite the Zofran return for reevaluation or go to the ER for IV hydration.    ED Prescriptions    Medication Sig Dispense Auth. Provider   ondansetron (ZOFRAN ODT) 8 MG disintegrating tablet Take 1 tablet (8 mg total) by mouth every 8 (eight) hours as needed for nausea or vomiting. 20 tablet Margarette Canada, NP   dicyclomine (BENTYL) 20 MG tablet Take 1 tablet (20 mg total) by mouth 2 (two) times daily. 20 tablet Margarette Canada, NP     PDMP not reviewed this encounter.   Margarette Canada, NP 01/28/20 1016    Margarette Canada, NP 01/28/20 1020

## 2020-01-28 NOTE — Discharge Instructions (Addendum)
Hold your Ozempic as I believe this is what is the cause of your symptoms.  Use the Zofran every 8 hours as needed for nausea.  Take the Bentyl every 6 hours as needed for abdominal cramping.  Follow a clear liquid diet for the next 12 hours and then slowly increase as tolerated.  You need to try and increase your oral fluid intake to help increase your urine production and flush your system.  If you are unable to keep down fluids despite the Zofran return for reevaluation or go to the ER for IV hydration.

## 2020-01-29 LAB — URINE CULTURE
Culture: <10000 — AB
Special Requests: NORMAL

## 2020-03-10 ENCOUNTER — Other Ambulatory Visit: Payer: BLUE CROSS/BLUE SHIELD

## 2020-03-10 DIAGNOSIS — Z20822 Contact with and (suspected) exposure to covid-19: Secondary | ICD-10-CM

## 2020-03-11 LAB — SARS-COV-2, NAA 2 DAY TAT

## 2020-03-11 LAB — NOVEL CORONAVIRUS, NAA: SARS-CoV-2, NAA: NOT DETECTED
